# Patient Record
Sex: Male | Born: 1995
Health system: Southern US, Community
[De-identification: ages and names within clinical notes are randomized; demographics above are authoritative.]

## PROBLEM LIST (undated history)

## (undated) DIAGNOSIS — J45909 Unspecified asthma, uncomplicated: Secondary | ICD-10-CM

---
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
## 2015-08-27 ENCOUNTER — Encounter (HOSPITAL_COMMUNITY): Payer: Self-pay | Admitting: Emergency Medicine

## 2015-08-27 ENCOUNTER — Emergency Department (HOSPITAL_COMMUNITY)
Admission: EM | Admit: 2015-08-27 | Discharge: 2015-08-27 | Discharge status: Against Medical Advice | Payer: Medicaid Other | Attending: Emergency Medicine | Admitting: Emergency Medicine

## 2015-08-27 DIAGNOSIS — R197 Diarrhea, unspecified: Secondary | ICD-10-CM | POA: Insufficient documentation

## 2015-08-27 DIAGNOSIS — R1084 Generalized abdominal pain: Secondary | ICD-10-CM | POA: Diagnosis not present

## 2015-08-27 DIAGNOSIS — R1110 Vomiting, unspecified: Secondary | ICD-10-CM | POA: Diagnosis not present

## 2015-08-27 DIAGNOSIS — J45909 Unspecified asthma, uncomplicated: Secondary | ICD-10-CM | POA: Insufficient documentation

## 2015-08-27 LAB — LIPASE, BLOOD: Lipase: 30 U/L (ref 11–51)

## 2015-08-27 LAB — COMPREHENSIVE METABOLIC PANEL
ALT: 19 U/L (ref 17–63)
AST: 19 U/L (ref 15–41)
Albumin: 4.3 g/dL (ref 3.5–5.0)
Alkaline Phosphatase: 66 U/L (ref 38–126)
Anion gap: 10 (ref 5–15)
BUN: 17 mg/dL (ref 6–20)
CO2: 27 mmol/L (ref 22–32)
Calcium: 9.5 mg/dL (ref 8.9–10.3)
Chloride: 105 mmol/L (ref 101–111)
Creatinine, Ser: 0.92 mg/dL (ref 0.61–1.24)
GFR calc Af Amer: >60 mL/min (ref >60)
GFR calc non Af Amer: >60 mL/min (ref >60)
Glucose, Bld: 104 mg/dL — ABNORMAL HIGH (ref 65–99)
Potassium: 4.1 mmol/L (ref 3.5–5.1)
Sodium: 142 mmol/L (ref 135–145)
Total Bilirubin: 0.8 mg/dL (ref 0.3–1.2)
Total Protein: 7.5 g/dL (ref 6.5–8.1)

## 2015-08-27 LAB — CBC
HCT: 50.2 % (ref 39.0–52.0)
Hemoglobin: 17.1 g/dL — ABNORMAL HIGH (ref 13.0–17.0)
MCH: 30.3 pg (ref 26.0–34.0)
MCHC: 34.1 g/dL (ref 30.0–36.0)
MCV: 88.8 fL (ref 78.0–100.0)
Platelets: 233 10*3/uL (ref 150–400)
RBC: 5.65 MIL/uL (ref 4.22–5.81)
RDW: 12.4 % (ref 11.5–15.5)
WBC: 12.4 10*3/uL — ABNORMAL HIGH (ref 4.0–10.5)

## 2015-08-27 MED ORDER — ONDANSETRON 4 MG PO TBDP
ORAL_TABLET | ORAL | Status: AC
  Filled 2015-08-27: qty 1

## 2015-08-27 MED ORDER — ONDANSETRON 4 MG PO TBDP
4.0000 mg | ORAL_TABLET | Freq: Once | ORAL | Status: AC | PRN
  Administered 2015-08-27: 4 mg via ORAL

## 2015-08-27 NOTE — ED Notes (Signed)
No answer for room placement.

## 2015-08-27 NOTE — ED Notes (Signed)
Pt states around 11am today he had a sudden onset of vomiting and diarrhea. 5 episodes since 11am. Pt states he has generalized abd pain over his entire abdomen.

## 2015-08-27 NOTE — ED Notes (Signed)
No answer for vital sign recheck. 

## 2016-02-14 ENCOUNTER — Emergency Department (HOSPITAL_COMMUNITY)
Admission: EM | Admit: 2016-02-14 | Discharge: 2016-02-14 | Disposition: A | Discharge status: Home / Self Care | Payer: Medicaid Other | Attending: Emergency Medicine | Admitting: Emergency Medicine

## 2016-02-14 ENCOUNTER — Encounter (HOSPITAL_COMMUNITY): Payer: Self-pay

## 2016-02-14 DIAGNOSIS — R369 Urethral discharge, unspecified: Secondary | ICD-10-CM

## 2016-02-14 DIAGNOSIS — J45909 Unspecified asthma, uncomplicated: Secondary | ICD-10-CM | POA: Insufficient documentation

## 2016-02-14 DIAGNOSIS — N342 Other urethritis: Secondary | ICD-10-CM | POA: Insufficient documentation

## 2016-02-14 LAB — URINALYSIS, ROUTINE W REFLEX MICROSCOPIC
Glucose, UA: NEGATIVE mg/dL
Ketones, ur: 15 mg/dL — AB
Nitrite: NEGATIVE
Protein, ur: 100 mg/dL — AB
Specific Gravity, Urine: >1.03 — ABNORMAL HIGH (ref 1.005–1.030)
pH: 7 (ref 5.0–8.0)

## 2016-02-14 LAB — URINE MICROSCOPIC-ADD ON

## 2016-02-14 MED ORDER — METRONIDAZOLE 500 MG PO TABS
2000.0000 mg | ORAL_TABLET | Freq: Once | ORAL | Status: AC
  Administered 2016-02-14: 2000 mg via ORAL
  Filled 2016-02-14: qty 4

## 2016-02-14 MED ORDER — STERILE WATER FOR INJECTION IJ SOLN
INTRAMUSCULAR | Status: AC
  Filled 2016-02-14: qty 10

## 2016-02-14 MED ORDER — AZITHROMYCIN 250 MG PO TABS
1000.0000 mg | ORAL_TABLET | Freq: Once | ORAL | Status: AC
  Administered 2016-02-14: 1000 mg via ORAL
  Filled 2016-02-14: qty 4

## 2016-02-14 MED ORDER — CEFTRIAXONE SODIUM 250 MG IJ SOLR
250.0000 mg | Freq: Once | INTRAMUSCULAR | Status: AC
  Administered 2016-02-14: 250 mg via INTRAMUSCULAR
  Filled 2016-02-14: qty 250

## 2016-02-14 NOTE — Discharge Instructions (Signed)
You have been tested for STDs. Some of these results are still pending. Any abnormalities will be called to you. You have been prophylactically treated for gonorrhea, Chlamydia, and Trichomonas. This does not mean you necessarily have these diseases, treatment is precautionary. Be sure to follow safe sex practices, including monogamy and/or condom use. Follow up with PCP as needed should symptoms fail to resolve.

## 2016-02-14 NOTE — ED Notes (Signed)
See PA asessment 

## 2016-02-14 NOTE — ED Provider Notes (Signed)
CSN: [id number]     Arrival date & time 02/14/16  1124 History  By signing my name below, I, Evon Slackerrance Branch, attest that this documentation has been prepared under the direction and in the presence of Courtney Fenlon C Phillip Sandler, PA-C. Electronically Signed: Evon Slackerrance Branch, ED Scribe. 02/14/2016. 11:34 AM.    Chief Complaint  Patient presents with  . Penile Discharge  . Dysuria   Patient is a 20 y.o. male presenting with penile discharge and dysuria. The history is provided by the patient. No language interpreter was used.  Penile Discharge Pertinent negatives include no abdominal pain.  Dysuria Pertinent negatives include no abdominal pain.   HPI Comments: Jaime Morton is a 20 y.o. male who presents to the Emergency Department complaining of penile discharge onset 2 days prior. Pt states that he has associated dysuria and hematuria. Pt also reports dry rash to his groin area. Pt doesn't report any treatments tried PTA. Pt reports that his last sexual encounter was about 3 days prior to the onset of symptoms. Pt states that he has been sexually active with 2 different partners within the last month. Pt denies testicle pain or swelling, abdominal pain, fever, pain with bowel movements, or any other complaints.    Past Medical History  Diagnosis Date  . Asthma    History reviewed. No pertinent past surgical history. No family history on file. Social History  Substance Use Topics  . Smoking status: Never Smoker   . Smokeless tobacco: None  . Alcohol Use: No    Review of Systems  Constitutional: Negative for fever.  Gastrointestinal: Negative for abdominal pain.  Genitourinary: Positive for dysuria, hematuria and discharge. Negative for testicular pain.  All other systems reviewed and are negative.     Allergies  Review of patient's allergies indicates no known allergies.  Home Medications   Prior to Admission medications   Medication Sig Start Date End Date Taking? Authorizing  Provider  albuterol (PROVENTIL HFA;VENTOLIN HFA) 108 (90 BASE) MCG/ACT inhaler Inhale 2 puffs into the lungs every 6 (six) hours as needed for wheezing.    Historical Provider, MD  albuterol (PROVENTIL HFA;VENTOLIN HFA) 108 (90 BASE) MCG/ACT inhaler Inhale 2 puffs into the lungs every 6 (six) hours as needed for wheezing or shortness of breath. 01/05/14   Jess BartersJennifer Lee H Presson, PA   BP 137/83 mmHg  Pulse 72  Temp(Src) 98.2 F (36.8 C) (Oral)  Resp 22  SpO2 98%   Physical Exam  Constitutional: He appears well-developed and well-nourished. No distress.  HENT:  Head: Normocephalic and atraumatic.  Eyes: Conjunctivae are normal.  Neck: Neck supple.  Cardiovascular: Normal rate and regular rhythm.   Pulmonary/Chest: Effort normal. No respiratory distress.  Abdominal: Soft. There is no tenderness. There is no guarding.  Genitourinary:  Thick, yellow white discharge expressed from the urethral opening. Penis, scrotum, and testicles without swelling, lesions, or tenderness. Possible epispadias. Otherwise normal male genitalia. Scribe, Teacher, musicTerrance, served as Biomedical engineerchaperone during the exam.  Musculoskeletal: He exhibits no edema or tenderness.  Lymphadenopathy:    He has no cervical adenopathy.       Right: No inguinal adenopathy present.       Left: No inguinal adenopathy present.  Neurological: He is alert.  Skin: Skin is warm and dry. He is not diaphoretic.  Psychiatric: He has a normal mood and affect. His behavior is normal.  Nursing note and vitals reviewed.   ED Course  Procedures (including critical care time) DIAGNOSTIC STUDIES: Oxygen Saturation is  98% on RA, normal by my interpretation.    COORDINATION OF CARE: 11:35 AM-Discussed treatment plan which includes UA with pt at bedside and pt agreed to plan.     Labs Review Labs Reviewed  URINALYSIS, ROUTINE W REFLEX MICROSCOPIC (NOT AT [facility]) - Abnormal; Notable for the following:    Color, Urine AMBER (*)    APPearance TURBID (*)     Specific Gravity, Urine >1.030 (*)    Hgb urine dipstick LARGE (*)    Bilirubin Urine SMALL (*)    Ketones, ur 15 (*)    Protein, ur 100 (*)    Leukocytes, UA MODERATE (*)    All other components within normal limits  URINE MICROSCOPIC-ADD ON - Abnormal; Notable for the following:    Squamous Epithelial / LPF 6-30 (*)    Bacteria, UA MANY (*)    All other components within normal limits  URINE CULTURE  RPR  HIV ANTIBODY (ROUTINE TESTING)  GC/CHLAMYDIA PROBE AMP ([place]) NOT AT [facility]      EKG Interpretation None      MDM   Final diagnoses:  Penile discharge  Infective urethritis    Jaime Morton presents with penile discharge and dysuria for the last 2-3 days.  Patient has no signs or symptoms of systemic illness. STD likely. Urinalysis shows UTI, likely urethritis. Patient treated with azithromycin, Rocephin, and Flagyl. Patient was advised he has labs pending and any abnormal results will be called directly to him. Safe sex practices discussed. Return precautions discussed. Patient voiced understanding of all instructions and is comfortable with discharge.  Filed Vitals:   02/14/16 1130 02/14/16 1252  BP: 137/83 139/70  Pulse: 72 89  Temp: 98.2 F (36.8 C) 98.2 F (36.8 C)  TempSrc: Oral Oral  Resp: 22 19  SpO2: 98% 99%     I personally performed the services described in this documentation, which was scribed in my presence. The recorded information has been reviewed and is accurate.   Anselm PancoastShawn C Sarai January, PA-C 02/14/16 1306  Doug SouSam Jacubowitz, MD 02/14/16 1731  Doug SouSam Jacubowitz, MD 03/31/16 [phone]

## 2016-02-14 NOTE — ED Notes (Signed)
Patient complains of dysuria and penile discharge x 2 days, also reports dry rash to groin

## 2016-02-15 LAB — URINE CULTURE

## 2016-02-15 LAB — HIV ANTIBODY (ROUTINE TESTING W REFLEX): HIV Screen 4th Generation wRfx: NONREACTIVE

## 2016-02-15 LAB — RPR: RPR Ser Ql: NONREACTIVE

## 2016-02-16 LAB — GC/CHLAMYDIA PROBE AMP (~~LOC~~) NOT AT ARMC
Chlamydia: POSITIVE — AB
Neisseria Gonorrhea: POSITIVE — AB

## 2016-02-17 ENCOUNTER — Telehealth (HOSPITAL_BASED_OUTPATIENT_CLINIC_OR_DEPARTMENT_OTHER): Payer: Self-pay | Admitting: Emergency Medicine

## 2016-06-02 ENCOUNTER — Telehealth (HOSPITAL_BASED_OUTPATIENT_CLINIC_OR_DEPARTMENT_OTHER): Payer: Self-pay | Admitting: Emergency Medicine

## 2016-06-02 NOTE — Telephone Encounter (Signed)
Lost to followup 

## 2016-08-11 ENCOUNTER — Encounter (HOSPITAL_COMMUNITY): Payer: Self-pay

## 2016-08-11 ENCOUNTER — Emergency Department (HOSPITAL_COMMUNITY)
Admission: EM | Admit: 2016-08-11 | Discharge: 2016-08-11 | Disposition: A | Discharge status: Home / Self Care | Payer: Medicaid Other | Attending: Emergency Medicine | Admitting: Emergency Medicine

## 2016-08-11 DIAGNOSIS — J45909 Unspecified asthma, uncomplicated: Secondary | ICD-10-CM | POA: Insufficient documentation

## 2016-08-11 DIAGNOSIS — Z202 Contact with and (suspected) exposure to infections with a predominantly sexual mode of transmission: Secondary | ICD-10-CM

## 2016-08-11 LAB — URINALYSIS, ROUTINE W REFLEX MICROSCOPIC
Bilirubin Urine: NEGATIVE
GLUCOSE, UA: NEGATIVE mg/dL
Hgb urine dipstick: NEGATIVE
KETONES UR: NEGATIVE mg/dL
Leukocytes, UA: NEGATIVE
Nitrite: NEGATIVE
PH: 5 (ref 5.0–8.0)
Protein, ur: 30 mg/dL — AB
SPECIFIC GRAVITY, URINE: 1.032 — AB (ref 1.005–1.030)

## 2016-08-11 LAB — RPR: RPR: NONREACTIVE

## 2016-08-11 LAB — HIV ANTIBODY (ROUTINE TESTING W REFLEX): HIV SCREEN 4TH GENERATION: NONREACTIVE

## 2016-08-11 MED ORDER — CEFTRIAXONE SODIUM 250 MG IJ SOLR
250.0000 mg | Freq: Once | INTRAMUSCULAR | Status: AC
  Administered 2016-08-11: 250 mg via INTRAMUSCULAR
  Filled 2016-08-11: qty 250

## 2016-08-11 MED ORDER — AZITHROMYCIN 250 MG PO TABS
1000.0000 mg | ORAL_TABLET | Freq: Once | ORAL | Status: AC
  Administered 2016-08-11: 1000 mg via ORAL
  Filled 2016-08-11: qty 4

## 2016-08-11 NOTE — ED Provider Notes (Signed)
  [facility]-EMERGENCY DEPT Provider Note   CSN: [id number] Arrival date & time: 08/11/16  0746     History   Chief Complaint Chief Complaint  Patient presents with  . Exposure to STD    HPI Jaime Morton is a 20 y.o. male.  HPI Patient is here because his girlfriend reportedly has gonorrhea. Patient is without symptoms. He has had unprotected sex with her. No dysuria. No rash. No penile discharge. Past Medical History:  Diagnosis Date  . Asthma     There are no active problems to display for this patient.   History reviewed. No pertinent surgical history.     Home Medications    Prior to Admission medications   Medication Sig Start Date End Date Taking? Authorizing Provider  albuterol (PROVENTIL HFA;VENTOLIN HFA) 108 (90 BASE) MCG/ACT inhaler Inhale 2 puffs into the lungs every 6 (six) hours as needed for wheezing.    Historical Provider, MD  albuterol (PROVENTIL HFA;VENTOLIN HFA) 108 (90 BASE) MCG/ACT inhaler Inhale 2 puffs into the lungs every 6 (six) hours as needed for wheezing or shortness of breath. 01/05/14   Ria ClockJennifer Lee H Presson, PA    Family History Family History  Problem Relation Age of Onset  . Diabetes Father     Social History Social History  Substance Use Topics  . Smoking status: Never Smoker  . Smokeless tobacco: Never Used  . Alcohol use No     Allergies   Patient has no known allergies.   Review of Systems Review of Systems  Constitutional: Negative for fever.  Gastrointestinal: Negative for abdominal pain.  Endocrine: Negative for polyuria.  Genitourinary: Negative for difficulty urinating, discharge, flank pain, genital sores, penile pain, scrotal swelling and urgency.     Physical Exam Updated Vital Signs BP 120/76 (BP Location: Right Arm)   Pulse 87   Temp 98.1 F (36.7 C) (Oral)   Resp 16   Ht 6\' 1"  (1.854 m)   Wt 180 lb (81.6 kg)   SpO2 99%   BMI 23.75 kg/m   Physical Exam  Constitutional: He appears  well-developed.  HENT:  Head: Normocephalic.  Abdominal: There is no tenderness.  Genitourinary: Penis normal.     ED Treatments / Results  Labs (all labs ordered are listed, but only abnormal results are displayed) Labs Reviewed  RPR  HIV ANTIBODY (ROUTINE TESTING)  URINALYSIS, ROUTINE W REFLEX MICROSCOPIC  GC/CHLAMYDIA PROBE AMP ([place]) NOT AT [facility]    EKG  EKG Interpretation None       Radiology No results found.  Procedures Procedures (including critical care time)  Medications Ordered in ED Medications  cefTRIAXone (ROCEPHIN) injection 250 mg (250 mg Intramuscular Given 08/11/16 0828)  azithromycin (ZITHROMAX) tablet 1,000 mg (1,000 mg Oral Given 08/11/16 0827)     Initial Impression / Assessment and Plan / ED Course  I have reviewed the triage vital signs and the nursing notes.  Pertinent labs & imaging results that were available during my care of the patient were reviewed by me and considered in my medical decision making (see chart for details).  Clinical Course     Patient with STD exposure. Tested and treated  Final Clinical Impressions(s) / ED Diagnoses   Final diagnoses:  Exposure to STD    New Prescriptions New Prescriptions   No medications on file     Benjiman CoreNathan Buryl Bamber, MD 08/11/16 [phone]
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
## 2016-08-12 LAB — GC/CHLAMYDIA PROBE AMP (~~LOC~~) NOT AT ARMC
Chlamydia: NEGATIVE
Neisseria Gonorrhea: NEGATIVE

## 2019-03-21 ENCOUNTER — Other Ambulatory Visit: Payer: Self-pay

## 2019-03-21 DIAGNOSIS — Z20822 Contact with and (suspected) exposure to covid-19: Secondary | ICD-10-CM

## 2019-03-22 LAB — NOVEL CORONAVIRUS, NAA: SARS-CoV-2, NAA: NOT DETECTED

## 2019-07-18 ENCOUNTER — Other Ambulatory Visit: Payer: Self-pay

## 2019-07-18 DIAGNOSIS — Z20822 Contact with and (suspected) exposure to covid-19: Secondary | ICD-10-CM

## 2019-07-21 LAB — NOVEL CORONAVIRUS, NAA: SARS-CoV-2, NAA: NOT DETECTED

## 2020-08-17 ENCOUNTER — Other Ambulatory Visit: Payer: Self-pay

## 2020-08-17 ENCOUNTER — Encounter (HOSPITAL_COMMUNITY): Payer: Self-pay

## 2020-08-17 ENCOUNTER — Ambulatory Visit (HOSPITAL_COMMUNITY)
Admission: RE | Admit: 2020-08-17 | Discharge: 2020-08-17 | Disposition: A | Discharge status: Home / Self Care | Payer: PRIVATE HEALTH INSURANCE | Source: Ambulatory Visit | Attending: Internal Medicine | Admitting: Internal Medicine

## 2020-08-17 DIAGNOSIS — U071 COVID-19: Secondary | ICD-10-CM | POA: Insufficient documentation

## 2020-08-17 NOTE — ED Triage Notes (Signed)
Pt reports that starting [place] eve he  A fever,cough and general body aches.

## 2020-08-18 LAB — SARS CORONAVIRUS 2 (TAT 6-24 HRS): SARS Coronavirus 2: POSITIVE — AB

## 2020-10-21 ENCOUNTER — Emergency Department (HOSPITAL_COMMUNITY): Payer: PRIVATE HEALTH INSURANCE

## 2020-10-21 ENCOUNTER — Other Ambulatory Visit: Payer: Self-pay

## 2020-10-21 ENCOUNTER — Encounter (HOSPITAL_COMMUNITY): Payer: Self-pay

## 2020-10-21 ENCOUNTER — Emergency Department (HOSPITAL_COMMUNITY)
Admission: EM | Admit: 2020-10-21 | Discharge: 2020-10-21 | Disposition: A | Discharge status: Home / Self Care | Payer: PRIVATE HEALTH INSURANCE | Attending: Emergency Medicine | Admitting: Emergency Medicine

## 2020-10-21 DIAGNOSIS — Y9355 Activity, bike riding: Secondary | ICD-10-CM | POA: Diagnosis not present

## 2020-10-21 DIAGNOSIS — S62001A Unspecified fracture of navicular [scaphoid] bone of right wrist, initial encounter for closed fracture: Secondary | ICD-10-CM | POA: Insufficient documentation

## 2020-10-21 DIAGNOSIS — J45909 Unspecified asthma, uncomplicated: Secondary | ICD-10-CM | POA: Diagnosis not present

## 2020-10-21 DIAGNOSIS — S6991XA Unspecified injury of right wrist, hand and finger(s), initial encounter: Secondary | ICD-10-CM | POA: Diagnosis present

## 2020-10-21 DIAGNOSIS — S52124A Nondisplaced fracture of head of right radius, initial encounter for closed fracture: Secondary | ICD-10-CM | POA: Insufficient documentation

## 2020-10-21 NOTE — Progress Notes (Signed)
Orthopedic Tech Progress Note Patient Details:  Jaime Morton 09-02-1995 [id number]  Ortho Devices Type of Ortho Device: Ace wrap,Thumb spica splint Splint Material: Fiberglass Ortho Device/Splint Location: RUE splint and  sling Ortho Device/Splint Interventions: Ordered,Application   Post Interventions Patient Tolerated: Well Instructions Provided: Care of device   Jennye Moccasin 10/21/2020, 8:44 PM
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
## 2020-10-21 NOTE — ED Notes (Signed)
An After Visit Summary was printed and given to the patient. Discharge instructions given and no further questions at this time.  Pt strongly encouraged to follow up with hand doctor due to pts fractures.

## 2020-10-21 NOTE — Discharge Instructions (Signed)
I recommend a combination of tylenol and ibuprofen for management of your pain. You can take a low dose of both at the same time. I recommend 500 mg of Tylenol combined with 800 mg of ibuprofen. This is 2 regular Tylenol and 4 regular ibuprofen. You can take these 2-3 times for day for your pain. Please try to take these medications with a small amount of food as well to prevent upsetting your stomach.  Please also make sure that you are applying ice to your right elbow and right wrist.  This will help with both pain as well as swelling.  Below is the contact information for Dr. Arita Miss.  He is a Actuary doctor.  Please call him first thing tomorrow morning to schedule a follow-up appointment for both your wrist as well as your elbow.  If you develop new or worsening symptoms, you need to return the emergency department immediately for reevaluation.  It was a pleasure to meet you.

## 2020-10-21 NOTE — ED Notes (Signed)
Ortho tech bedside 
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
## 2020-10-22 ENCOUNTER — Encounter: Payer: Self-pay | Admitting: Plastic Surgery

## 2020-10-22 ENCOUNTER — Other Ambulatory Visit: Payer: Self-pay

## 2020-10-22 ENCOUNTER — Ambulatory Visit (INDEPENDENT_AMBULATORY_CARE_PROVIDER_SITE_OTHER): Payer: PRIVATE HEALTH INSURANCE | Admitting: Plastic Surgery

## 2020-10-22 VITALS — BP 124/79 | HR 92 | Ht 73.0 in | Wt 243.0 lb

## 2020-10-22 DIAGNOSIS — S62024A Nondisplaced fracture of middle third of navicular [scaphoid] bone of right wrist, initial encounter for closed fracture: Secondary | ICD-10-CM
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
## 2020-11-05 ENCOUNTER — Ambulatory Visit: Payer: PRIVATE HEALTH INSURANCE | Admitting: Plastic Surgery
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
## 2023-01-30 ENCOUNTER — Emergency Department (EMERGENCY_DEPARTMENT_HOSPITAL): Payer: Self-pay | Admitting: Anesthesiology

## 2023-01-30 ENCOUNTER — Emergency Department (HOSPITAL_COMMUNITY): Payer: Self-pay

## 2023-01-30 ENCOUNTER — Encounter (HOSPITAL_COMMUNITY)
Admission: EM | Disposition: A | Discharge status: Home / Self Care | Payer: Self-pay | Source: Home / Self Care | Attending: Emergency Medicine

## 2023-01-30 ENCOUNTER — Encounter (HOSPITAL_COMMUNITY): Payer: Self-pay

## 2023-01-30 ENCOUNTER — Ambulatory Visit (HOSPITAL_COMMUNITY)
Admission: EM | Admit: 2023-01-30 | Discharge: 2023-01-30 | Disposition: A | Discharge status: Home / Self Care | Payer: Self-pay | Attending: Orthopedic Surgery | Admitting: Orthopedic Surgery

## 2023-01-30 ENCOUNTER — Emergency Department (HOSPITAL_COMMUNITY): Payer: Self-pay | Admitting: Anesthesiology

## 2023-01-30 DIAGNOSIS — S52271A Monteggia's fracture of right ulna, initial encounter for closed fracture: Secondary | ICD-10-CM | POA: Diagnosis not present

## 2023-01-30 DIAGNOSIS — S5291XA Unspecified fracture of right forearm, initial encounter for closed fracture: Secondary | ICD-10-CM | POA: Diagnosis present

## 2023-01-30 DIAGNOSIS — Z79899 Other long term (current) drug therapy: Secondary | ICD-10-CM | POA: Insufficient documentation

## 2023-01-30 DIAGNOSIS — S52501A Unspecified fracture of the lower end of right radius, initial encounter for closed fracture: Secondary | ICD-10-CM | POA: Insufficient documentation

## 2023-01-30 DIAGNOSIS — S52201A Unspecified fracture of shaft of right ulna, initial encounter for closed fracture: Secondary | ICD-10-CM

## 2023-01-30 DIAGNOSIS — J45909 Unspecified asthma, uncomplicated: Secondary | ICD-10-CM

## 2023-01-30 DIAGNOSIS — S53004A Unspecified dislocation of right radial head, initial encounter: Secondary | ICD-10-CM | POA: Diagnosis present

## 2023-01-30 DIAGNOSIS — E669 Obesity, unspecified: Secondary | ICD-10-CM

## 2023-01-30 DIAGNOSIS — Z6831 Body mass index (BMI) 31.0-31.9, adult: Secondary | ICD-10-CM

## 2023-01-30 HISTORY — PX: CLOSED REDUCTION ELBOW FRACTURE: SHX930

## 2023-01-30 LAB — COMPREHENSIVE METABOLIC PANEL
ALT: 44 U/L (ref 0–44)
AST: 33 U/L (ref 15–41)
Albumin: 4 g/dL (ref 3.5–5.0)
Alkaline Phosphatase: 57 U/L (ref 38–126)
Anion gap: 9 (ref 5–15)
BUN: 19 mg/dL (ref 6–20)
CO2: 23 mmol/L (ref 22–32)
Calcium: 8.9 mg/dL (ref 8.9–10.3)
Chloride: 103 mmol/L (ref 98–111)
Creatinine, Ser: 1.26 mg/dL — ABNORMAL HIGH (ref 0.61–1.24)
GFR, Estimated: >60 mL/min (ref >60)
Glucose, Bld: 164 mg/dL — ABNORMAL HIGH (ref 70–99)
Potassium: 3.3 mmol/L — ABNORMAL LOW (ref 3.5–5.1)
Sodium: 135 mmol/L (ref 135–145)
Total Bilirubin: 0.6 mg/dL (ref 0.3–1.2)
Total Protein: 7.3 g/dL (ref 6.5–8.1)

## 2023-01-30 LAB — CBC
HCT: 42.8 % (ref 39.0–52.0)
Hemoglobin: 14.4 g/dL (ref 13.0–17.0)
MCH: 29.6 pg (ref 26.0–34.0)
MCHC: 33.6 g/dL (ref 30.0–36.0)
MCV: 88.1 fL (ref 80.0–100.0)
Platelets: 274 10*3/uL (ref 150–400)
RBC: 4.86 MIL/uL (ref 4.22–5.81)
RDW: 12.1 % (ref 11.5–15.5)
WBC: 16.2 10*3/uL — ABNORMAL HIGH (ref 4.0–10.5)
nRBC: 0 % (ref 0.0–0.2)

## 2023-01-30 LAB — SURGICAL PCR SCREEN
MRSA, PCR: NEGATIVE
Staphylococcus aureus: NEGATIVE

## 2023-01-30 LAB — I-STAT CHEM 8, ED
BUN: 20 mg/dL (ref 6–20)
Calcium, Ion: 1.21 mmol/L (ref 1.15–1.40)
Chloride: 104 mmol/L (ref 98–111)
Creatinine, Ser: 1.1 mg/dL (ref 0.61–1.24)
Glucose, Bld: 164 mg/dL — ABNORMAL HIGH (ref 70–99)
HCT: 45 % (ref 39.0–52.0)
Hemoglobin: 15.3 g/dL (ref 13.0–17.0)
Potassium: 3.5 mmol/L (ref 3.5–5.1)
Sodium: 140 mmol/L (ref 135–145)
TCO2: 23 mmol/L (ref 22–32)

## 2023-01-30 LAB — PROTIME-INR
INR: 1.1 (ref 0.8–1.2)
Prothrombin Time: 13.9 seconds (ref 11.4–15.2)

## 2023-01-30 LAB — APTT: aPTT: 29 seconds (ref 24–36)

## 2023-01-30 SURGERY — CLOSED REDUCTION, ELBOW
Anesthesia: Monitor Anesthesia Care | Site: Elbow | Laterality: Right

## 2023-01-30 MED ORDER — FENTANYL CITRATE (PF) 100 MCG/2ML IJ SOLN
INTRAMUSCULAR | Status: AC
  Filled 2023-01-30: qty 2

## 2023-01-30 MED ORDER — OXYCODONE HCL 5 MG/5ML PO SOLN: 5.0000 mg | Freq: Once | ORAL | Status: AC | PRN

## 2023-01-30 MED ORDER — OXYCODONE HCL 5 MG PO TABS
5.0000 mg | ORAL_TABLET | Freq: Once | ORAL | Status: AC | PRN
  Administered 2023-01-30: 5 mg via ORAL

## 2023-01-30 MED ORDER — ORAL CARE MOUTH RINSE: 15.0000 mL | Freq: Once | OROMUCOSAL | Status: AC

## 2023-01-30 MED ORDER — CHLORHEXIDINE GLUCONATE 4 % EX SOLN: 60.0000 mL | Freq: Once | CUTANEOUS | Status: DC

## 2023-01-30 MED ORDER — CHLORHEXIDINE GLUCONATE 0.12 % MT SOLN
OROMUCOSAL | Status: AC
  Administered 2023-01-30: 15 mL via OROMUCOSAL
  Filled 2023-01-30: qty 15

## 2023-01-30 MED ORDER — PROPOFOL 10 MG/ML IV BOLUS
INTRAVENOUS | Status: AC
  Filled 2023-01-30: qty 20

## 2023-01-30 MED ORDER — IOHEXOL 350 MG/ML SOLN
75.0000 mL | Freq: Once | INTRAVENOUS | Status: AC | PRN
  Administered 2023-01-30: 75 mL via INTRAVENOUS

## 2023-01-30 MED ORDER — LACTATED RINGERS IV SOLN: INTRAVENOUS | Status: DC

## 2023-01-30 MED ORDER — HYDROMORPHONE HCL 1 MG/ML IJ SOLN
1.0000 mg | Freq: Once | INTRAMUSCULAR | Status: AC
  Administered 2023-01-30: 1 mg via INTRAVENOUS
  Filled 2023-01-30: qty 1

## 2023-01-30 MED ORDER — PROPOFOL 10 MG/ML IV BOLUS
INTRAVENOUS | Status: DC | PRN
  Administered 2023-01-30: 50 mg via INTRAVENOUS

## 2023-01-30 MED ORDER — OXYCODONE-ACETAMINOPHEN 5-325 MG PO TABS: 1.0000 | ORAL_TABLET | ORAL | 0 refills | Status: DC | PRN

## 2023-01-30 MED ORDER — MIDAZOLAM HCL 2 MG/2ML IJ SOLN
INTRAMUSCULAR | Status: DC | PRN
  Administered 2023-01-30 (×2): 2 mg via INTRAVENOUS

## 2023-01-30 MED ORDER — ONDANSETRON HCL 4 MG/2ML IJ SOLN: 4.0000 mg | Freq: Four times a day (QID) | INTRAMUSCULAR | Status: DC | PRN

## 2023-01-30 MED ORDER — CHLORHEXIDINE GLUCONATE 0.12 % MT SOLN: 15.0000 mL | Freq: Once | OROMUCOSAL | Status: AC

## 2023-01-30 MED ORDER — OXYCODONE HCL 5 MG PO TABS
ORAL_TABLET | ORAL | Status: AC
  Filled 2023-01-30: qty 1

## 2023-01-30 MED ORDER — MIDAZOLAM HCL 2 MG/2ML IJ SOLN
INTRAMUSCULAR | Status: AC
  Filled 2023-01-30: qty 2

## 2023-01-30 MED ORDER — FENTANYL CITRATE (PF) 100 MCG/2ML IJ SOLN
25.0000 ug | INTRAMUSCULAR | Status: DC | PRN
  Administered 2023-01-30: 50 ug via INTRAVENOUS

## 2023-01-30 MED ORDER — PROPOFOL 500 MG/50ML IV EMUL
INTRAVENOUS | Status: DC | PRN
  Administered 2023-01-30: 125 ug/kg/min via INTRAVENOUS

## 2023-01-30 MED ORDER — SODIUM CHLORIDE 0.9 % IV BOLUS
1000.0000 mL | Freq: Once | INTRAVENOUS | Status: AC
  Administered 2023-01-30: 1000 mL via INTRAVENOUS

## 2023-01-30 MED ORDER — LIDOCAINE 2% (20 MG/ML) 5 ML SYRINGE
INTRAMUSCULAR | Status: DC | PRN
  Administered 2023-01-30: 40 mg via INTRAVENOUS

## 2023-01-30 SURGICAL SUPPLY — 4 items
BNDG CMPR 5X4 KNIT ELC UNQ LF (GAUZE/BANDAGES/DRESSINGS) ×2
BNDG ELASTIC 4INX 5YD STR LF (GAUZE/BANDAGES/DRESSINGS) IMPLANT
PADDING CAST ABS COTTON 3X4 (CAST SUPPLIES) IMPLANT
SPLINT FIBERGLASS 3X12 (CAST SUPPLIES) IMPLANT
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
## 2023-01-30 NOTE — ED Notes (Signed)
Transfer of care report received from previous RN. 

## 2023-01-30 NOTE — Transfer of Care (Signed)
Immediate Anesthesia Transfer of Care Note  Patient: Jaime Morton  Procedure(s) Performed: CLOSED REDUCTION FOREARM (Right: Elbow)  Patient Location: PACU  Anesthesia Type:MAC  Level of Consciousness: drowsy  Airway & Oxygen Therapy: Patient Spontanous Breathing and Patient connected to face mask oxygen  Post-op Assessment: Report given to RN and Post -op Vital signs reviewed and stable  Post vital signs: Reviewed and stable  Last Vitals:  Vitals Value Taken Time  BP 152/104 01/30/23 1145  Temp    Pulse 105 01/30/23 1150  Resp 40 01/30/23 1150  SpO2 97 % 01/30/23 1150  Vitals shown include unvalidated device data.  Last Pain:  Vitals:   01/30/23 0904  TempSrc: Oral  PainSc:          Complications: No notable events documented.
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
## 2023-01-30 NOTE — Progress Notes (Signed)
[facility] Discharge Note  Patient instuctions were given to family. Wound care, diet, pain, follow up care and how and whom to contact with concerns were discussed. Family aware someone needs to remain with patient overnight and concerns after receiving anesthesia and what to avoid and safety. Answered all questions and concerns.   Discharge paperwork has clear contact informations for surgeon and 24 hour RN line for concerns.   Discussed what concerns to look for including infection and signs/symptoms to look for.   Discussed keeping arm elevated, ice as needed.   Pt will go to CT after discharge from [facility] and before Mom can take him home.   IV was removed prior to discharge. Patient was brought to car with belongings.   Pt exits my care.

## 2023-01-30 NOTE — Anesthesia Postprocedure Evaluation (Signed)
Anesthesia Post Note  Patient: Jaime Morton  Procedure(s) Performed: CLOSED REDUCTION FOREARM (Right: Elbow)     Patient location during evaluation: PACU Anesthesia Type: MAC Level of consciousness: awake and alert Pain management: pain level controlled Vital Signs Assessment: post-procedure vital signs reviewed and stable Respiratory status: spontaneous breathing, nonlabored ventilation, respiratory function stable and patient connected to nasal cannula oxygen Cardiovascular status: stable and blood pressure returned to baseline Postop Assessment: no apparent nausea or vomiting Anesthetic complications: no   No notable events documented.  Last Vitals:  Vitals:   01/30/23 1315 01/30/23 1320  BP: (!) 165/99 (!) 182/95  Pulse: 98 98  Resp: (!) 21 18  Temp:  36.7 C  SpO2: 93% 92%    Last Pain:  Vitals:   01/30/23 1320  TempSrc:   PainSc: 4                  Laurajean Hosek S
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
## 2023-01-30 NOTE — Discharge Instructions (Signed)
KEEP BANDAGE CLEAN AND DRY °CALL OFFICE FOR F/U APPT [phone] IN 2 DAYS °KEEP HAND ELEVATED ABOVE HEART °OK TO APPLY ICE TO OPERATIVE AREA °CONTACT OFFICE IF ANY WORSENING PAIN OR CONCERNS. °

## 2023-01-30 NOTE — Anesthesia Procedure Notes (Signed)
Procedure Name: MAC Date/Time: 01/30/2023 11:23 AM  Performed by: Tressia Miners, CRNAPre-anesthesia Checklist: Patient identified, Emergency Drugs available, Suction available and Patient being monitored Patient Re-evaluated:Patient Re-evaluated prior to induction Oxygen Delivery Method: Simple face mask

## 2023-01-30 NOTE — Anesthesia Preprocedure Evaluation (Signed)
Anesthesia Evaluation  Patient identified by MRN, date of birth, ID band Patient awake    Reviewed: Allergy & Precautions, H&P , NPO status , Patient's Chart, lab work & pertinent test results  Airway Mallampati: II   Neck ROM: full    Dental   Pulmonary asthma    breath sounds clear to auscultation       Cardiovascular negative cardio ROS  Rhythm:regular Rate:Normal     Neuro/Psych    GI/Hepatic   Endo/Other  obese  Renal/GU      Musculoskeletal   Abdominal   Peds  Hematology   Anesthesia Other Findings   Reproductive/Obstetrics                             Anesthesia Physical Anesthesia Plan  ASA: 2  Anesthesia Plan: MAC   Post-op Pain Management:    Induction: Intravenous  PONV Risk Score and Plan: 1 and Propofol infusion, Midazolam and Treatment may vary due to age or medical condition  Airway Management Planned: Simple Face Mask  Additional Equipment:   Intra-op Plan:   Post-operative Plan:   Informed Consent: I have reviewed the patients History and Physical, chart, labs and discussed the procedure including the risks, benefits and alternatives for the proposed anesthesia with the patient or authorized representative who has indicated his/her understanding and acceptance.     Dental advisory given  Plan Discussed with: CRNA, Anesthesiologist and Surgeon  Anesthesia Plan Comments:        Anesthesia Quick Evaluation

## 2023-01-31 ENCOUNTER — Encounter (HOSPITAL_COMMUNITY): Payer: Self-pay | Admitting: Orthopedic Surgery

## 2023-01-31 ENCOUNTER — Other Ambulatory Visit (HOSPITAL_COMMUNITY): Payer: Self-pay | Admitting: Orthopedic Surgery

## 2023-01-31 ENCOUNTER — Ambulatory Visit (HOSPITAL_COMMUNITY)
Admission: RE | Admit: 2023-01-31 | Discharge: 2023-01-31 | Disposition: A | Discharge status: Home / Self Care | Payer: Self-pay | Source: Ambulatory Visit | Attending: Orthopedic Surgery | Admitting: Orthopedic Surgery

## 2023-01-31 DIAGNOSIS — S42401A Unspecified fracture of lower end of right humerus, initial encounter for closed fracture: Secondary | ICD-10-CM

## 2023-01-31 DIAGNOSIS — T148XXA Other injury of unspecified body region, initial encounter: Secondary | ICD-10-CM

## 2023-02-02 ENCOUNTER — Ambulatory Visit (HOSPITAL_BASED_OUTPATIENT_CLINIC_OR_DEPARTMENT_OTHER): Payer: Self-pay

## 2023-02-02 ENCOUNTER — Ambulatory Visit (HOSPITAL_COMMUNITY)
Admission: RE | Admit: 2023-02-02 | Discharge: 2023-02-02 | Disposition: A | Discharge status: Home / Self Care | Payer: Self-pay | Attending: Orthopedic Surgery | Admitting: Orthopedic Surgery

## 2023-02-02 ENCOUNTER — Encounter (HOSPITAL_BASED_OUTPATIENT_CLINIC_OR_DEPARTMENT_OTHER)
Admission: RE | Disposition: A | Discharge status: Home / Self Care | Payer: Self-pay | Source: Home / Self Care | Attending: Orthopedic Surgery

## 2023-02-02 ENCOUNTER — Ambulatory Visit (HOSPITAL_BASED_OUTPATIENT_CLINIC_OR_DEPARTMENT_OTHER): Payer: Self-pay | Admitting: Anesthesiology

## 2023-02-02 ENCOUNTER — Other Ambulatory Visit: Payer: Self-pay

## 2023-02-02 ENCOUNTER — Encounter (HOSPITAL_BASED_OUTPATIENT_CLINIC_OR_DEPARTMENT_OTHER): Payer: Self-pay | Admitting: Orthopedic Surgery

## 2023-02-02 DIAGNOSIS — S5331XA Traumatic rupture of right ulnar collateral ligament, initial encounter: Secondary | ICD-10-CM

## 2023-02-02 DIAGNOSIS — S52571A Other intraarticular fracture of lower end of right radius, initial encounter for closed fracture: Secondary | ICD-10-CM | POA: Diagnosis present

## 2023-02-02 DIAGNOSIS — S52121A Displaced fracture of head of right radius, initial encounter for closed fracture: Secondary | ICD-10-CM

## 2023-02-02 DIAGNOSIS — S52271A Monteggia's fracture of right ulna, initial encounter for closed fracture: Secondary | ICD-10-CM | POA: Insufficient documentation

## 2023-02-02 DIAGNOSIS — J45909 Unspecified asthma, uncomplicated: Secondary | ICD-10-CM | POA: Diagnosis not present

## 2023-02-02 DIAGNOSIS — S63591A Other specified sprain of right wrist, initial encounter: Secondary | ICD-10-CM | POA: Diagnosis not present

## 2023-02-02 DIAGNOSIS — E669 Obesity, unspecified: Secondary | ICD-10-CM

## 2023-02-02 DIAGNOSIS — Z79899 Other long term (current) drug therapy: Secondary | ICD-10-CM | POA: Diagnosis not present

## 2023-02-02 DIAGNOSIS — Z01818 Encounter for other preprocedural examination: Secondary | ICD-10-CM

## 2023-02-02 DIAGNOSIS — Z6833 Body mass index (BMI) 33.0-33.9, adult: Secondary | ICD-10-CM

## 2023-02-02 HISTORY — PX: ORIF ELBOW FRACTURE: SHX5031

## 2023-02-02 HISTORY — PX: ORIF WRIST FRACTURE: SHX2133

## 2023-02-02 SURGERY — OPEN REDUCTION INTERNAL FIXATION (ORIF) WRIST FRACTURE
Anesthesia: Monitor Anesthesia Care | Site: Wrist | Laterality: Right

## 2023-02-02 MED ORDER — HYDROMORPHONE HCL 1 MG/ML IJ SOLN: 0.2500 mg | INTRAMUSCULAR | Status: DC | PRN

## 2023-02-02 MED ORDER — ACETAMINOPHEN 500 MG PO TABS
1000.0000 mg | ORAL_TABLET | Freq: Once | ORAL | Status: AC
  Administered 2023-02-02: 1000 mg via ORAL

## 2023-02-02 MED ORDER — POVIDONE-IODINE 7.5 % EX SOLN: Freq: Once | CUTANEOUS | Status: DC

## 2023-02-02 MED ORDER — PROPOFOL 500 MG/50ML IV EMUL
INTRAVENOUS | Status: DC | PRN
  Administered 2023-02-02: 75 ug/kg/min via INTRAVENOUS

## 2023-02-02 MED ORDER — ACETAMINOPHEN 500 MG PO TABS
ORAL_TABLET | ORAL | Status: AC
  Filled 2023-02-02: qty 2

## 2023-02-02 MED ORDER — KETOROLAC TROMETHAMINE 30 MG/ML IJ SOLN: 30.0000 mg | Freq: Once | INTRAMUSCULAR | Status: DC | PRN

## 2023-02-02 MED ORDER — AMISULPRIDE (ANTIEMETIC) 5 MG/2ML IV SOLN: 10.0000 mg | Freq: Once | INTRAVENOUS | Status: DC | PRN

## 2023-02-02 MED ORDER — DEXMEDETOMIDINE HCL IN NACL 80 MCG/20ML IV SOLN
INTRAVENOUS | Status: DC | PRN
  Administered 2023-02-02: 12 ug via INTRAVENOUS
  Administered 2023-02-02: 8 ug via INTRAVENOUS

## 2023-02-02 MED ORDER — OXYCODONE-ACETAMINOPHEN 10-325 MG PO TABS: 1.0000 | ORAL_TABLET | Freq: Four times a day (QID) | ORAL | 0 refills | Status: AC | PRN

## 2023-02-02 MED ORDER — ROPIVACAINE HCL 5 MG/ML IJ SOLN
INTRAMUSCULAR | Status: DC | PRN
  Administered 2023-02-02: 25 mL via PERINEURAL

## 2023-02-02 MED ORDER — 0.9 % SODIUM CHLORIDE (POUR BTL) OPTIME
TOPICAL | Status: DC | PRN
  Administered 2023-02-02 (×3): 150 mL

## 2023-02-02 MED ORDER — MEPERIDINE HCL 25 MG/ML IJ SOLN: 6.2500 mg | INTRAMUSCULAR | Status: DC | PRN

## 2023-02-02 MED ORDER — LACTATED RINGERS IV SOLN: INTRAVENOUS | Status: DC

## 2023-02-02 MED ORDER — PROPOFOL 500 MG/50ML IV EMUL
INTRAVENOUS | Status: AC
  Filled 2023-02-02: qty 50

## 2023-02-02 MED ORDER — PROPOFOL 10 MG/ML IV BOLUS
INTRAVENOUS | Status: AC
  Filled 2023-02-02: qty 20

## 2023-02-02 MED ORDER — ONDANSETRON HCL 4 MG/2ML IJ SOLN
INTRAMUSCULAR | Status: AC
  Filled 2023-02-02: qty 2

## 2023-02-02 MED ORDER — LIDOCAINE HCL (CARDIAC) PF 100 MG/5ML IV SOSY: PREFILLED_SYRINGE | INTRAVENOUS | Status: DC | PRN

## 2023-02-02 MED ORDER — CEFAZOLIN SODIUM-DEXTROSE 2-4 GM/100ML-% IV SOLN
INTRAVENOUS | Status: AC
  Filled 2023-02-02: qty 100

## 2023-02-02 MED ORDER — MIDAZOLAM HCL 2 MG/2ML IJ SOLN
2.0000 mg | Freq: Once | INTRAMUSCULAR | Status: AC
  Administered 2023-02-02: 2 mg via INTRAVENOUS

## 2023-02-02 MED ORDER — OXYCODONE HCL 5 MG/5ML PO SOLN: 5.0000 mg | Freq: Once | ORAL | Status: DC | PRN

## 2023-02-02 MED ORDER — OXYCODONE HCL 5 MG PO TABS: 5.0000 mg | ORAL_TABLET | Freq: Once | ORAL | Status: DC | PRN

## 2023-02-02 MED ORDER — LIDOCAINE 2% (20 MG/ML) 5 ML SYRINGE
INTRAMUSCULAR | Status: DC | PRN
  Administered 2023-02-02: 40 mg via INTRAVENOUS

## 2023-02-02 MED ORDER — DEXAMETHASONE SODIUM PHOSPHATE 10 MG/ML IJ SOLN
INTRAMUSCULAR | Status: DC | PRN
  Administered 2023-02-02: 10 mg

## 2023-02-02 MED ORDER — ONDANSETRON HCL 4 MG/2ML IJ SOLN
INTRAMUSCULAR | Status: DC | PRN
  Administered 2023-02-02: 4 mg via INTRAVENOUS

## 2023-02-02 MED ORDER — CEFAZOLIN SODIUM-DEXTROSE 2-4 GM/100ML-% IV SOLN
2.0000 g | INTRAVENOUS | Status: AC
  Administered 2023-02-02: 2 g via INTRAVENOUS

## 2023-02-02 MED ORDER — POVIDONE-IODINE 10 % EX SWAB: 2.0000 | Freq: Once | CUTANEOUS | Status: DC

## 2023-02-02 MED ORDER — FENTANYL CITRATE (PF) 100 MCG/2ML IJ SOLN
100.0000 ug | Freq: Once | INTRAMUSCULAR | Status: AC
  Administered 2023-02-02: 100 ug via INTRAVENOUS

## 2023-02-02 MED ORDER — MIDAZOLAM HCL 2 MG/2ML IJ SOLN
INTRAMUSCULAR | Status: AC
  Filled 2023-02-02: qty 2

## 2023-02-02 MED ORDER — FENTANYL CITRATE (PF) 100 MCG/2ML IJ SOLN
INTRAMUSCULAR | Status: AC
  Filled 2023-02-02: qty 2

## 2023-02-02 MED ORDER — LIDOCAINE 2% (20 MG/ML) 5 ML SYRINGE
INTRAMUSCULAR | Status: AC
  Filled 2023-02-02: qty 5

## 2023-02-02 MED ORDER — ONDANSETRON HCL 4 MG/2ML IJ SOLN: 4.0000 mg | Freq: Once | INTRAMUSCULAR | Status: DC | PRN

## 2023-02-02 SURGICAL SUPPLY — 131 items
ANCH SUT 2 SHRT 1.45 DRLBT (Anchor) ×4 IMPLANT
ANCHOR JUGGERKNOT W/DRL 2/1.45 (Anchor) IMPLANT
APL PRP STRL LF DISP 70% ISPRP (MISCELLANEOUS) ×2
APL SKNCLS STERI-STRIP NONHPOA (GAUZE/BANDAGES/DRESSINGS) ×2
BENZOIN TINCTURE PRP APPL 2/3 (GAUZE/BANDAGES/DRESSINGS) ×2 IMPLANT
BIT DRILL 2.2 SS TIBIAL (BIT) IMPLANT
BIT DRILL 2.5X2.75 QC CALB (BIT) IMPLANT
BIT DRILL CAL (BIT) IMPLANT
BIT DRILL CANN 2.4 (BIT) ×2
BIT DRILL CANN MAX VPC 2.4 (BIT) IMPLANT
BLADE HEX COATED 2.75 (ELECTRODE) ×2 IMPLANT
BLADE SURG 10 STRL SS (BLADE) ×2 IMPLANT
BLADE SURG 15 STRL LF DISP TIS (BLADE) ×2 IMPLANT
BLADE SURG 15 STRL SS (BLADE) ×2
BNDG CMPR 5X3 KNIT ELC UNQ LF (GAUZE/BANDAGES/DRESSINGS)
BNDG CMPR 5X4 CHSV STRCH STRL (GAUZE/BANDAGES/DRESSINGS) ×2
BNDG CMPR 5X4 KNIT ELC UNQ LF (GAUZE/BANDAGES/DRESSINGS) ×4
BNDG CMPR 9X4 STRL LF SNTH (GAUZE/BANDAGES/DRESSINGS) ×2
BNDG COHESIVE 4X5 TAN STRL LF (GAUZE/BANDAGES/DRESSINGS) ×2 IMPLANT
BNDG ELASTIC 3INX 5YD STR LF (GAUZE/BANDAGES/DRESSINGS) IMPLANT
BNDG ELASTIC 4INX 5YD STR LF (GAUZE/BANDAGES/DRESSINGS) ×4 IMPLANT
BNDG ESMARK 4X9 LF (GAUZE/BANDAGES/DRESSINGS) ×2 IMPLANT
BNDG GAUZE DERMACEA FLUFF 4 (GAUZE/BANDAGES/DRESSINGS) ×2 IMPLANT
BNDG GZE 12X3 1 PLY HI ABS (GAUZE/BANDAGES/DRESSINGS)
BNDG GZE DERMACEA 4 6PLY (GAUZE/BANDAGES/DRESSINGS) ×2
BNDG STRETCH GAUZE 3IN X12FT (GAUZE/BANDAGES/DRESSINGS) IMPLANT
CANISTER SUCT 1200ML W/VALVE (MISCELLANEOUS) ×2 IMPLANT
CHLORAPREP W/TINT 26 (MISCELLANEOUS) IMPLANT
CORD BIPOLAR FORCEPS 12FT (ELECTRODE) ×2 IMPLANT
COVER BACK TABLE 60X90IN (DRAPES) ×2 IMPLANT
CUFF TOURN SGL QUICK 18X3 (MISCELLANEOUS) IMPLANT
CUFF TOURN SGL QUICK 18X4 (TOURNIQUET CUFF) ×2 IMPLANT
DRAPE EXTREMITY T 121X128X90 (DISPOSABLE) ×2 IMPLANT
DRAPE IMP U-DRAPE 54X76 (DRAPES) IMPLANT
DRAPE INCISE IOBAN 66X45 STRL (DRAPES) IMPLANT
DRAPE OEC MINIVIEW 54X84 (DRAPES) ×2 IMPLANT
DRAPE U-SHAPE 47X51 STRL (DRAPES) ×2 IMPLANT
DRILL BIT CAL (BIT) ×2
DRSG ADAPTIC 3X8 NADH LF (GAUZE/BANDAGES/DRESSINGS) IMPLANT
ELECT NDL TIP 2.8 STRL (NEEDLE) IMPLANT
ELECT NEEDLE TIP 2.8 STRL (NEEDLE) IMPLANT
ELECT REM PT RETURN 9FT ADLT (ELECTROSURGICAL) ×2
ELECTRODE REM PT RTRN 9FT ADLT (ELECTROSURGICAL) ×2 IMPLANT
GAUZE SPONGE 4X4 12PLY STRL (GAUZE/BANDAGES/DRESSINGS) ×2 IMPLANT
GAUZE XEROFORM 1X8 LF (GAUZE/BANDAGES/DRESSINGS) IMPLANT
GLOVE BIO SURGEON STRL SZ 6.5 (GLOVE) ×2 IMPLANT
GLOVE BIOGEL PI IND STRL 6.5 (GLOVE) ×4 IMPLANT
GLOVE BIOGEL PI IND STRL 8.5 (GLOVE) ×2 IMPLANT
GLOVE ECLIPSE 6.5 STRL STRAW (GLOVE) ×2 IMPLANT
GLOVE SURG ORTHO 8.0 STRL STRW (GLOVE) ×2 IMPLANT
GOWN STRL REUS W/ TWL LRG LVL3 (GOWN DISPOSABLE) ×2 IMPLANT
GOWN STRL REUS W/ TWL XL LVL3 (GOWN DISPOSABLE) ×2 IMPLANT
GOWN STRL REUS W/TWL LRG LVL3 (GOWN DISPOSABLE) ×2
GOWN STRL REUS W/TWL XL LVL3 (GOWN DISPOSABLE) ×4 IMPLANT
K-WIRE 1.6 (WIRE) ×4
K-WIRE COCR 1.1X105 (WIRE) ×6
K-WIRE FX5X1.6XNS BN SS (WIRE) ×4
KWIRE COCR 1.1X105 (WIRE) IMPLANT
KWIRE FX5X1.6XNS BN SS (WIRE) IMPLANT
LOOP VASCLR MAXI BLUE 18IN ST (MISCELLANEOUS) IMPLANT
LOOP VASCULAR MAXI 18 BLUE (MISCELLANEOUS)
LOOPS VASCLR MAXI BLUE 18IN ST (MISCELLANEOUS) IMPLANT
NDL HYPO 25X1 1.5 SAFETY (NEEDLE) ×2 IMPLANT
NEEDLE HYPO 25X1 1.5 SAFETY (NEEDLE) IMPLANT
NS IRRIG 1000ML POUR BTL (IV SOLUTION) ×2 IMPLANT
PACK ARTHROSCOPY DSU (CUSTOM PROCEDURE TRAY) ×2 IMPLANT
PACK BASIN DAY SURGERY FS (CUSTOM PROCEDURE TRAY) ×2 IMPLANT
PAD CAST 4YDX4 CTTN HI CHSV (CAST SUPPLIES) ×4 IMPLANT
PADDING CAST ABS COTTON 4X4 ST (CAST SUPPLIES) ×2 IMPLANT
PADDING CAST COTTON 4X4 STRL (CAST SUPPLIES) ×4
PEG LOCKING SMOOTH 2.2X22 (Screw) IMPLANT
PEG LOCKING SMOOTH 2.2X24 (Peg) IMPLANT
PEG LOCKING SMOOTH 2.2X26 (Peg) IMPLANT
PENCIL SMOKE EVACUATOR (MISCELLANEOUS) ×2 IMPLANT
PLATE LOCK COMP 7H FOOT (Plate) IMPLANT
PLATE WIDE DVR RIGHT (Plate) IMPLANT
SCREW CANN MAX VPC 3.4X22 (Screw) IMPLANT
SCREW CORTICAL 3.5MM 16MM (Screw) IMPLANT
SCREW CORTICAL 3.5MM 18MM (Screw) IMPLANT
SCREW LOCK 14X2.7X 3 LD TPR (Screw) IMPLANT
SCREW LOCK 16X2.7X 3 LD TPR (Screw) IMPLANT
SCREW LOCK 18X2.7X 3 LD TPR (Screw) IMPLANT
SCREW LOCK 22X2.7X 3 LD TPR (Screw) IMPLANT
SCREW LOCKING 2.7X14 (Screw) ×2 IMPLANT
SCREW LOCKING 2.7X16 (Screw) ×4 IMPLANT
SCREW LOCKING 2.7X18 (Screw) ×6 IMPLANT
SCREW LOCKING 2.7X22MM (Screw) ×2 IMPLANT
SCREW MAX VPC 3.4X24MM (Screw) IMPLANT
SHEET MEDIUM DRAPE 40X70 STRL (DRAPES) IMPLANT
SLEEVE SCD COMPRESS KNEE MED (STOCKING) ×2 IMPLANT
SLING ARM FOAM STRAP LRG (SOFTGOODS) IMPLANT
SLING ARM FOAM STRAP XLG (SOFTGOODS) IMPLANT
SPLINT FIBERGLASS 3X35 (CAST SUPPLIES) IMPLANT
SPLINT FIBERGLASS 4X30 (CAST SUPPLIES) IMPLANT
SPONGE T-LAP 18X18 ~~LOC~~+RFID (SPONGE) ×2 IMPLANT
STAPLER VISISTAT 35W (STAPLE) IMPLANT
STOCKINETTE 4X48 STRL (DRAPES) ×2 IMPLANT
STRIP CLOSURE SKIN 1/2X4 (GAUZE/BANDAGES/DRESSINGS) ×2 IMPLANT
SUCTION TUBE FRAZIER 10FR DISP (SUCTIONS) ×2 IMPLANT
SUT FIBERWIRE 3-0 18 TAPR NDL (SUTURE)
SUT MERSILENE 4 0 P 3 (SUTURE) IMPLANT
SUT MNCRL AB 3-0 PS2 18 (SUTURE) IMPLANT
SUT MON AB 3-0 SH 27 (SUTURE)
SUT MON AB 3-0 SH27 (SUTURE) IMPLANT
SUT PROLENE 3 0 PS 1 (SUTURE) IMPLANT
SUT PROLENE 3 0 PS 2 (SUTURE) ×2 IMPLANT
SUT PROLENE 4 0 PS 2 18 (SUTURE) IMPLANT
SUT VIC AB 0 CT1 27 (SUTURE) ×4
SUT VIC AB 0 CT1 27XBRD ANBCTR (SUTURE) IMPLANT
SUT VIC AB 1 CT1 27 (SUTURE)
SUT VIC AB 1 CT1 27XBRD ANBCTR (SUTURE) IMPLANT
SUT VIC AB 2-0 CT1 (SUTURE) IMPLANT
SUT VIC AB 2-0 CT1 27 (SUTURE) ×6
SUT VIC AB 2-0 CT1 TAPERPNT 27 (SUTURE) IMPLANT
SUT VIC AB 2-0 PS2 27 (SUTURE) IMPLANT
SUT VIC AB 2-0 SH 27 (SUTURE)
SUT VIC AB 2-0 SH 27XBRD (SUTURE) IMPLANT
SUT VIC AB 3-0 SH 27 (SUTURE)
SUT VIC AB 3-0 SH 27X BRD (SUTURE) IMPLANT
SUT VIC AB 4-0 PS2 18 (SUTURE) IMPLANT
SUT VIC AB 4-0 SH 27 (SUTURE)
SUT VIC AB 4-0 SH 27XANBCTRL (SUTURE) IMPLANT
SUTURE FIBERWR 3-0 18 TAPR NDL (SUTURE) IMPLANT
SYR BULB EAR ULCER 3OZ GRN STR (SYRINGE) ×2 IMPLANT
SYR CONTROL 10ML LL (SYRINGE) ×2 IMPLANT
TOWEL GREEN STERILE FF (TOWEL DISPOSABLE) ×4 IMPLANT
TRAY DSU PREP LF (CUSTOM PROCEDURE TRAY) ×2 IMPLANT
TUBE CONNECTING 20X1/4 (TUBING) ×2 IMPLANT
UNDERPAD 30X36 HEAVY ABSORB (UNDERPADS AND DIAPERS) ×2 IMPLANT
VASCULAR TIE MAXI BLUE 18IN ST (MISCELLANEOUS)
YANKAUER SUCT BULB TIP NO VENT (SUCTIONS) ×2 IMPLANT

## 2023-02-02 NOTE — Anesthesia Procedure Notes (Signed)
Anesthesia Regional Block: Supraclavicular block   Pre-Anesthetic Checklist: , timeout performed,  Correct Patient, Correct Site, Correct Laterality,  Correct Procedure, Correct Position, site marked,  Risks and benefits discussed,  Surgical consent,  Pre-op evaluation,  At surgeon's request and post-op pain management  Laterality: Right  Prep: Maximum Sterile Barrier Precautions used, chloraprep       Needles:  Injection technique: Single-shot  Needle Type: Echogenic Stimulator Needle     Needle Length: 9cm  Needle Gauge: 22     Additional Needles:   Procedures:,,,, ultrasound used (permanent image in chart),,    Narrative:  Start time: 02/02/2023 12:00 PM End time: 02/02/2023 12:05 PM Injection made incrementally with aspirations every 5 mL.  Performed by: Personally  Anesthesiologist: Lannie Fields, DO  Additional Notes: Monitors applied. No increased pain on injection. No increased resistance to injection. Injection made in 5cc increments. Good needle visualization. Patient tolerated procedure well.

## 2023-02-02 NOTE — Anesthesia Procedure Notes (Signed)
Procedure Name: MAC Date/Time: 02/02/2023 12:56 PM  Performed by: Thornell Mule, CRNAOxygen Delivery Method: Simple face mask
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
## 2023-02-02 NOTE — Anesthesia Postprocedure Evaluation (Signed)
Anesthesia Post Note  Patient: [name]  Procedure(s) Performed: OPEN REDUCTION INTERNAL FIXATION (ORIF) WRIST FRACTURE (Right: Wrist) OPEN REDUCTION INTERNAL FIXATION (ORIF) ELBOW/OLECRANON FRACTURE (Right: Elbow)     Patient location during evaluation: PACU Anesthesia Type: Regional and MAC Level of consciousness: awake and alert Pain management: pain level controlled Vital Signs Assessment: post-procedure vital signs reviewed and stable Respiratory status: spontaneous breathing, nonlabored ventilation and respiratory function stable Cardiovascular status: blood pressure returned to baseline and stable Postop Assessment: no apparent nausea or vomiting Anesthetic complications: no   No notable events documented.  Last Vitals:  Vitals:   02/02/23 1205 02/02/23 1615  BP: 126/88 135/79  Pulse: 91 84  Resp: 15 18  Temp:    SpO2: 95% 96%    Last Pain:  Vitals:   02/02/23 1600  TempSrc:   PainSc: Asleep                 Jaime Morton

## 2023-02-02 NOTE — Progress Notes (Signed)
Assisted Dr. Finucane with right, supraclavicular, ultrasound guided block. Side rails up, monitors on throughout procedure. See vital signs in flow sheet. Tolerated Procedure well. 

## 2023-02-02 NOTE — Anesthesia Preprocedure Evaluation (Addendum)
Anesthesia Evaluation  Patient identified by MRN, date of birth, ID band Patient awake    Reviewed: Allergy & Precautions, NPO status , Patient's Chart, lab work & pertinent test results  Airway Mallampati: III  TM Distance: >3 FB Neck ROM: Full    Dental  (+) Teeth Intact, Dental Advisory Given   Pulmonary asthma (well controlled)    Pulmonary exam normal breath sounds clear to auscultation       Cardiovascular negative cardio ROS Normal cardiovascular exam Rhythm:Regular Rate:Normal     Neuro/Psych negative neurological ROS  negative psych ROS   GI/Hepatic negative GI ROS, Neg liver ROS,,,  Endo/Other  Obesity BMI 33  Renal/GU negative Renal ROS  negative genitourinary   Musculoskeletal negative musculoskeletal ROS (+)    Abdominal  (+) + obese  Peds  Hematology negative hematology ROS (+)   Anesthesia Other Findings   Reproductive/Obstetrics negative OB ROS                             Anesthesia Physical Anesthesia Plan  ASA: 2  Anesthesia Plan: Regional and MAC   Post-op Pain Management: Regional block* and Tylenol PO (pre-op)*   Induction: Intravenous  PONV Risk Score and Plan: Ondansetron, Dexamethasone, Midazolam, Treatment may vary due to age or medical condition, Propofol infusion and TIVA  Airway Management Planned: Natural Airway and Simple Face Mask  Additional Equipment: None  Intra-op Plan:   Post-operative Plan:   Informed Consent: I have reviewed the patients History and Physical, chart, labs and discussed the procedure including the risks, benefits and alternatives for the proposed anesthesia with the patient or authorized representative who has indicated his/her understanding and acceptance.       Plan Discussed with: CRNA  Anesthesia Plan Comments:        Anesthesia Quick Evaluation

## 2023-02-02 NOTE — Transfer of Care (Signed)
Immediate Anesthesia Transfer of Care Note  Patient: Jaime Morton  Procedure(s) Performed: OPEN REDUCTION INTERNAL FIXATION (ORIF) WRIST FRACTURE (Right: Wrist) OPEN REDUCTION INTERNAL FIXATION (ORIF) ELBOW/OLECRANON FRACTURE (Right: Elbow)  Patient Location: PACU  Anesthesia Type:MAC combined with regional for post-op pain  Level of Consciousness: drowsy  Airway & Oxygen Therapy: Patient Spontanous Breathing and Patient connected to face mask oxygen  Post-op Assessment: Report given to RN and Post -op Vital signs reviewed and stable  Post vital signs: Reviewed and stable  Last Vitals:  Vitals Value Taken Time  BP 121/90 (101)   Temp    Pulse 85 02/02/23 1609  Resp 22 02/02/23 1609  SpO2 98 % 02/02/23 1609  Vitals shown include unvalidated device data.  Last Pain:  Vitals:   02/02/23 1032  TempSrc: Oral  PainSc: 0-No pain         Complications: No notable events documented.
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
## 2023-02-02 NOTE — Discharge Instructions (Addendum)
KEEP BANDAGE CLEAN AND DRY CALL OFFICE FOR F/U APPT [id number] in 7 days KEEP HAND ELEVATED ABOVE HEART OK TO APPLY ICE TO OPERATIVE AREA CONTACT OFFICE IF ANY WORSENING PAIN OR CONCERNS.    Post Anesthesia Home Care Instructions  Activity: Get plenty of rest for the remainder of the day. A responsible individual must stay with you for 24 hours following the procedure.  For the next 24 hours, DO NOT: -Drive a car -Advertising copywriter -Drink alcoholic beverages -Take any medication unless instructed by your physician -Make any legal decisions or sign important papers.  Meals: Start with liquid foods such as gelatin or soup. Progress to regular foods as tolerated. Avoid greasy, spicy, heavy foods. If nausea and/or vomiting occur, drink only clear liquids until the nausea and/or vomiting subsides. Call your physician if vomiting continues.  Special Instructions/Symptoms: Your throat may feel dry or sore from the anesthesia or the breathing tube placed in your throat during surgery. If this causes discomfort, gargle with warm salt water. The discomfort should disappear within 24 hours.   Next dose of Tylenol can be taken at 430pm today if needed.  Anesthesia Blocks  1. Numbness or the inability to move the "blocked" extremity may last from 3-48 hours after placement. The length of time depends on the medication injected and your individual response to the medication. If the numbness is not going away after 48 hours, call your surgeon.  2. The extremity that is blocked will need to be protected until the numbness is gone and the  Strength has returned. Because you cannot feel it, you will need to take extra care to avoid injury. Because it may be weak, you may have difficulty moving it or using it. You may not know what position it is in without looking at it while the block is in effect.  3. For blocks in the legs and feet, returning to weight bearing and walking needs to be done carefully.  You will need to wait until the numbness is entirely gone and the strength has returned. You should be able to move your leg and foot normally before you try and bear weight or walk. You will need someone to be with you when you first try to ensure you do not fall and possibly risk injury.  4. Bruising and tenderness at the needle site are common side effects and will resolve in a few days.  5. Persistent numbness or new problems with movement should be communicated to the surgeon or the [facility] [phone] [facility] [phone]).

## 2023-02-03 ENCOUNTER — Encounter (HOSPITAL_BASED_OUTPATIENT_CLINIC_OR_DEPARTMENT_OTHER): Payer: Self-pay | Admitting: Orthopedic Surgery
